# Patient Record
Sex: Female | Born: 1970 | Race: White | Hispanic: No | Marital: Single | Smoking: Never smoker
Health system: Southern US, Community
[De-identification: ages and names within clinical notes are randomized; demographics above are authoritative.]

---

## 2011-08-12 ENCOUNTER — Emergency Department (HOSPITAL_COMMUNITY)
Admission: EM | Admit: 2011-08-12 | Discharge: 2011-08-12 | Disposition: A | Discharge status: Home / Self Care | Payer: BC Managed Care – PPO | Attending: Emergency Medicine | Admitting: Emergency Medicine

## 2011-08-12 ENCOUNTER — Emergency Department (HOSPITAL_COMMUNITY): Payer: BC Managed Care – PPO

## 2011-08-12 DIAGNOSIS — W108XXA Fall (on) (from) other stairs and steps, initial encounter: Secondary | ICD-10-CM | POA: Insufficient documentation

## 2011-08-12 DIAGNOSIS — S93409A Sprain of unspecified ligament of unspecified ankle, initial encounter: Secondary | ICD-10-CM

## 2011-08-12 DIAGNOSIS — Y92009 Unspecified place in unspecified non-institutional (private) residence as the place of occurrence of the external cause: Secondary | ICD-10-CM | POA: Insufficient documentation

## 2011-08-12 MED ORDER — IBUPROFEN 600 MG PO TABS: 600.0000 mg | ORAL_TABLET | Freq: Four times a day (QID) | ORAL | Status: AC | PRN

## 2011-08-12 NOTE — ED Provider Notes (Signed)
History     CSN: [id number] Arrival date & time: 08/12/2011  8:03 PM   First MD Initiated Contact with Patient 08/12/11 1956      Chief Complaint  Patient presents with  . Ankle Pain  . Fall    (Consider location/radiation/quality/duration/timing/severity/associated sxs/prior treatment) Patient is a 40 y.o. female presenting with ankle pain and fall. The history is provided by the patient.  Ankle Pain  The incident occurred 1 to 2 hours ago. The incident occurred at home. The injury mechanism was torsion. The pain is present in the left ankle. The quality of the pain is described as aching. The pain is at a severity of 5/10. The pain is moderate. The pain has been constant since onset. Associated symptoms include inability to bear weight. Pertinent negatives include no numbness, no loss of motion and no loss of sensation. The symptoms are aggravated by bearing weight. She has tried rest for the symptoms.  Fall Pertinent negatives include no fever, no numbness, no abdominal pain, no nausea and no headaches.    History reviewed. No pertinent past medical history.  History reviewed. No pertinent past surgical history.  No family history on file.  History  Substance Use Topics  . Smoking status: Never Smoker   . Smokeless tobacco: Not on file  . Alcohol Use: No    OB History    Grav Para Term Preterm Abortions TAB SAB Ect Mult Living                  Review of Systems  Constitutional: Negative for fever.  HENT: Negative for congestion, sore throat and neck pain.   Eyes: Negative.   Respiratory: Negative for chest tightness and shortness of breath.   Cardiovascular: Negative for chest pain.  Gastrointestinal: Negative for nausea and abdominal pain.  Genitourinary: Negative.   Musculoskeletal: Positive for joint swelling and arthralgias.  Skin: Negative.  Negative for rash and wound.  Neurological: Negative for dizziness, weakness, light-headedness, numbness and  headaches.  Hematological: Negative.   Psychiatric/Behavioral: Negative.     Allergies  Penicillins  Home Medications  No current outpatient prescriptions on file.  BP 130/85  Pulse 85  Temp(Src) 98.3 F (36.8 C) (Oral)  Resp 16  Ht 5\' 11"  (1.803 m)  Wt 200 lb (90.719 kg)  BMI 27.89 kg/m2  SpO2 100%  LMP 08/12/2011  Physical Exam  Nursing note and vitals reviewed. Constitutional: She is oriented to person, place, and time. She appears well-developed and well-nourished.  HENT:  Head: Normocephalic.  Eyes: Conjunctivae are normal.  Neck: Normal range of motion.  Cardiovascular: Normal rate and intact distal pulses.  Exam reveals no decreased pulses.   Pulses:      Dorsalis pedis pulses are 2+ on the right side, and 2+ on the left side.       Posterior tibial pulses are 2+ on the right side, and 2+ on the left side.  Pulmonary/Chest: Effort normal.  Musculoskeletal: She exhibits edema and tenderness.       Left ankle: She exhibits decreased range of motion and swelling. She exhibits no deformity. tenderness. Lateral malleolus and CF ligament tenderness found. No proximal fibula tenderness found. Achilles tendon normal.  Neurological: She is alert and oriented to person, place, and time. No sensory deficit.  Skin: Skin is warm, dry and intact.    ED Course  Procedures (including critical care time)  Labs Reviewed - No data to display Dg Ankle Complete Left  08/12/2011  *RADIOLOGY REPORT*  Clinical Data: Lateral malleolus pain.  Swelling after fall down stairs.  LEFT ANKLE COMPLETE - 3+ VIEW  Comparison: None.  Findings: Ankle is located.  The mortise is intact.  There is prominent soft tissue swelling anteriorly and laterally.  No acute fracture is identified.  No focal bony abnormality.  IMPRESSION:  1.  Prominent soft tissue swelling anteriorly and laterally. 2.  No acute bony abnormality identified.  Original Report Authenticated By: Britta Mccreedy, M.D.     No diagnosis  found.    MDM  [place],  Crutches.  Ibuprofen.        Candis Musa, PA 08/12/11 2031

## 2011-08-12 NOTE — ED Notes (Signed)
Pt presents with left ankle pain and swelling. Pt states she fell down some steps and twisted her ankle.

## 2011-08-13 NOTE — ED Provider Notes (Signed)
Medical screening examination/treatment/procedure(s) were performed by non-physician practitioner and as supervising physician I was immediately available for consultation/collaboration.   Shelda Jakes, MD 08/13/11 [phone]

## 2011-09-27 ENCOUNTER — Other Ambulatory Visit: Payer: Self-pay | Admitting: Obstetrics & Gynecology

## 2011-09-27 DIAGNOSIS — Z139 Encounter for screening, unspecified: Secondary | ICD-10-CM

## 2011-10-16 ENCOUNTER — Ambulatory Visit (HOSPITAL_COMMUNITY)
Admission: RE | Admit: 2011-10-16 | Discharge: 2011-10-16 | Disposition: A | Discharge status: Home / Self Care | Payer: BC Managed Care – PPO | Source: Ambulatory Visit | Attending: Obstetrics & Gynecology | Admitting: Obstetrics & Gynecology

## 2011-10-16 ENCOUNTER — Other Ambulatory Visit (HOSPITAL_COMMUNITY)
Admission: RE | Admit: 2011-10-16 | Discharge: 2011-10-16 | Disposition: A | Discharge status: Home / Self Care | Payer: BC Managed Care – PPO | Source: Ambulatory Visit | Attending: Obstetrics & Gynecology | Admitting: Obstetrics & Gynecology

## 2011-10-16 ENCOUNTER — Other Ambulatory Visit: Payer: Self-pay | Admitting: Obstetrics & Gynecology

## 2011-10-16 DIAGNOSIS — Z139 Encounter for screening, unspecified: Secondary | ICD-10-CM

## 2011-10-16 DIAGNOSIS — Z01419 Encounter for gynecological examination (general) (routine) without abnormal findings: Secondary | ICD-10-CM | POA: Insufficient documentation

## 2011-10-16 DIAGNOSIS — Z1231 Encounter for screening mammogram for malignant neoplasm of breast: Secondary | ICD-10-CM | POA: Insufficient documentation

## 2011-10-23 ENCOUNTER — Other Ambulatory Visit: Payer: Self-pay | Admitting: Obstetrics & Gynecology

## 2011-10-23 DIAGNOSIS — R928 Other abnormal and inconclusive findings on diagnostic imaging of breast: Secondary | ICD-10-CM

## 2011-11-15 ENCOUNTER — Other Ambulatory Visit: Payer: Self-pay | Admitting: Obstetrics & Gynecology

## 2011-11-15 ENCOUNTER — Ambulatory Visit (HOSPITAL_COMMUNITY)
Admission: RE | Admit: 2011-11-15 | Discharge: 2011-11-15 | Disposition: A | Discharge status: Home / Self Care | Payer: BC Managed Care – PPO | Source: Ambulatory Visit | Attending: Obstetrics & Gynecology | Admitting: Obstetrics & Gynecology

## 2011-11-15 DIAGNOSIS — R928 Other abnormal and inconclusive findings on diagnostic imaging of breast: Secondary | ICD-10-CM

## 2017-01-22 ENCOUNTER — Other Ambulatory Visit: Payer: Self-pay | Admitting: Adult Health

## 2017-01-22 DIAGNOSIS — Z1231 Encounter for screening mammogram for malignant neoplasm of breast: Secondary | ICD-10-CM

## 2017-02-01 ENCOUNTER — Ambulatory Visit (HOSPITAL_COMMUNITY)
Admission: RE | Admit: 2017-02-01 | Discharge: 2017-02-01 | Disposition: A | Discharge status: Home / Self Care | Payer: 59 | Source: Ambulatory Visit | Attending: Adult Health | Admitting: Adult Health

## 2017-02-01 ENCOUNTER — Other Ambulatory Visit (HOSPITAL_COMMUNITY)
Admission: RE | Admit: 2017-02-01 | Discharge: 2017-02-01 | Disposition: A | Discharge status: Home / Self Care | Payer: 59 | Source: Ambulatory Visit | Attending: Adult Health | Admitting: Adult Health

## 2017-02-01 ENCOUNTER — Encounter: Payer: Self-pay | Admitting: Adult Health

## 2017-02-01 ENCOUNTER — Ambulatory Visit (INDEPENDENT_AMBULATORY_CARE_PROVIDER_SITE_OTHER): Payer: 59 | Admitting: Adult Health

## 2017-02-01 VITALS — BP 120/90 | HR 80 | Ht 71.0 in | Wt 218.0 lb

## 2017-02-01 DIAGNOSIS — Z1211 Encounter for screening for malignant neoplasm of colon: Secondary | ICD-10-CM | POA: Insufficient documentation

## 2017-02-01 DIAGNOSIS — Z1212 Encounter for screening for malignant neoplasm of rectum: Secondary | ICD-10-CM | POA: Insufficient documentation

## 2017-02-01 DIAGNOSIS — Z01419 Encounter for gynecological examination (general) (routine) without abnormal findings: Secondary | ICD-10-CM | POA: Diagnosis not present

## 2017-02-01 DIAGNOSIS — Z1231 Encounter for screening mammogram for malignant neoplasm of breast: Secondary | ICD-10-CM

## 2017-02-01 LAB — HEMOCCULT GUIAC POC 1CARD (OFFICE): FECAL OCCULT BLD: NEGATIVE

## 2017-02-01 NOTE — Progress Notes (Signed)
Patient ID: Virginia BaileyRebekah Zuniga, female   DOB: 01/03/1971, 46 y.o.   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   Current Medications, Allergies, Past Medical History, Past Surgical History, Family History and Social History were reviewed in [facility] electronic medical record.     Review of Systems: Patient denies any headaches, hearing loss, fatigue, blurred vision, shortness of breath, chest pain, abdominal pain, problems with bowel movements, urination, or intercourse(never had sex). No joint pain or mood swings.    Physical Exam:BP 120/90 (BP Location: Right Arm, Patient Position: Sitting, Cuff Size: Small)   Pulse 80   Ht 5\' 11"  (1.803 m)   Wt 218 lb (98.9 kg)   LMP 01/11/2017   BMI 30.40 kg/m  General:  Well developed, well nourished, no acute distress Skin:  Warm and dry Neck:  Midline trachea, normal thyroid, good ROM, no lymphadenopathy Lungs; Clear to auscultation bilaterally Breast:  No dominant palpable mass, retraction, or nipple discharge Cardiovascular: Regular rate and rhythm Abdomen:  Soft, non tender, no hepatosplenomegaly Pelvic:  External genitalia is normal in appearance, no lesions.  The vagina is normal in appearance. Urethra has no lesions or masses. The cervix is smooth, pap with HPV performed.  Uterus is felt to be normal size, shape, and contour.  No adnexal masses or tenderness noted.Bladder is non tender, no masses felt. Rectal: Good sphincter tone, no polyps, or hemorrhoids felt.  Hemoccult negative. Extremities/musculoskeletal:  No swelling or varicosities noted, no clubbing or cyanosis Psych:  No mood changes, alert and cooperative,seems happy PHQ 9 score 0.  Impression: 1. Encounter for routine gynecological examination with Papanicolaou smear of cervix   2. Screening  for colorectal cancer       Plan: Pap in 3 years if normal  Had normal mammogram today

## 2017-02-01 NOTE — Addendum Note (Signed)
Addended by: Federico FlakeNES, Anadelia Kintz A on: 02/01/2017 12:27 PM   Modules accepted: Orders

## 2017-02-06 LAB — CYTOLOGY - PAP
ADEQUACY: ABSENT
Diagnosis: NEGATIVE
HPV: NOT DETECTED
